# Patient Record
Sex: Female | Born: 2005 | Hispanic: No | Marital: Single | Smoking: Never smoker
Health system: Southern US, Community
[De-identification: ages and names within clinical notes are randomized; demographics above are authoritative.]

## PROBLEM LIST (undated history)

## (undated) DIAGNOSIS — F919 Conduct disorder, unspecified: Secondary | ICD-10-CM

## (undated) DIAGNOSIS — F909 Attention-deficit hyperactivity disorder, unspecified type: Secondary | ICD-10-CM

## (undated) HISTORY — DX: Conduct disorder, unspecified: F91.9

## (undated) HISTORY — PX: NO PAST SURGERIES: SHX2092

---
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
## 2024-01-03 LAB — OVA AND PARASITE EXAMINATION
CONCENTRATE RESULT:: NONE SEEN
MICRO NUMBER:: 16683350
SPECIMEN QUALITY:: ADEQUATE
TRICHROME RESULT:: NONE SEEN

## 2024-01-03 LAB — GIARDIA AND CRYPTOSPORIDIUM ANTIGEN PANEL
MICRO NUMBER:: 16683349
RESULT:: NOT DETECTED
SPECIMEN QUALITY:: ADEQUATE
Specimen Quality:: ADEQUATE
micro Number:: 16683471

## 2024-01-03 LAB — SALMONELLA/SHIGELLA CULT, CAMPY EIA AND SHIGA TOXIN RFL ECOLI
MICRO NUMBER: 16683829
MICRO NUMBER:: 16683830
MICRO NUMBER:: 16683831
Result:: NOT DETECTED
SHIGA RESULT:: NOT DETECTED
SPECIMEN QUALITY: ADEQUATE
SPECIMEN QUALITY:: ADEQUATE
SPECIMEN QUALITY:: ADEQUATE

## 2024-01-03 LAB — GASTROINTESTINAL PATHOGEN PNL

## 2024-01-03 LAB — CALPROTECTIN: Calprotectin: <5 ug/g

## 2024-01-28 ENCOUNTER — Ambulatory Visit
Admission: EM | Admit: 2024-01-28 | Discharge: 2024-01-28 | Disposition: A | Discharge status: Home / Self Care | Attending: Family Medicine | Admitting: Family Medicine

## 2024-01-28 DIAGNOSIS — Z113 Encounter for screening for infections with a predominantly sexual mode of transmission: Secondary | ICD-10-CM | POA: Insufficient documentation

## 2024-01-28 LAB — HIV ANTIBODY (ROUTINE TESTING W REFLEX): HIV Screen 4th Generation wRfx: NONREACTIVE

## 2024-01-28 NOTE — ED Triage Notes (Signed)
 Pt presents to UC for STD testing. Denies any active sx's.

## 2024-01-28 NOTE — Discharge Instructions (Signed)
 The clinic will contact you with the results of the testing done today if positive.  Follow-up as needed.

## 2024-01-28 NOTE — ED Provider Notes (Signed)
 [facility]    CSN: [id number] Arrival date & time: 01/28/24  1352      History   Chief Complaint Chief Complaint  Patient presents with   SEXUALLY TRANSMITTED DISEASE    HPI Ebony Snow is a 18 y.o. female presents for evaluation of STD screening.  She currently denies any symptoms including vaginal discharge, dysuria, fevers, nausea/vomiting, flank pain.  No known STD exposure.  Patient states she was tested 1 month ago and was negative.  No other concerns at this time.  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      Home Medications    Prior to Admission medications   Medication Sig Start Date End Date Taking? Authorizing Provider  loratadine  (CLARITIN ) 10 MG tablet Take 1 tablet (10 mg total) by mouth daily. 10/20/21   Sowles, Krichna, MD  medroxyPROGESTERone  (DEPO-PROVERA ) 150 MG/ML injection Inject 1 mL (150 mg total) into the muscle every 3 (three) months. 06/29/23   Sowles, Krichna, MD  methocarbamol  (ROBAXIN ) 500 MG tablet Take 1 tablet (500 mg total) by mouth every 8 (eight) hours as needed for muscle spasms. 08/05/23   Triplett, Kirk B, FNP  naproxen  (NAPROSYN ) 500 MG tablet Take 1 tablet (500 mg total) by mouth 2 (two) times daily with a meal. 08/05/23   Triplett, Kirk NOVAK, FNP    Family History Family History  Problem Relation Age  of Onset   Food Allergy Brother    Alcohol abuse Maternal Uncle    Clotting disorder Cousin     Social History Social History   Tobacco Use   Smoking status: Never   Smokeless tobacco: Never  Vaping Use   Vaping status: Never Used  Substance Use Topics   Alcohol use: Yes   Drug use: No     Allergies   Patient has no known allergies.   Review of Systems Review of Systems  Genitourinary:        STD screening     Physical Exam Triage Vital Signs ED Triage Vitals  Encounter Vitals Group     BP 01/28/24 1358 114/67     Girls Systolic BP Percentile --      Girls Diastolic BP Percentile --      Boys Systolic BP Percentile --      Boys Diastolic BP Percentile --      Pulse Rate 01/28/24 1358 77     Resp 01/28/24 1358 16     Temp 01/28/24 1358 98.1 F (36.7 C)     Temp Source 01/28/24 1358 Oral     SpO2 01/28/24 1358 95 %     Weight 01/28/24 1358 115 lb (52.2 kg)     Height 01/28/24 1358 5' 2 (1.575 m)     Head Circumference --      Peak Flow --  Pain Score 01/28/24 1400 0     Pain Loc --      Pain Education --      Exclude from Growth Chart --    No data found.  Updated Vital Signs BP 114/67 (BP Location: Right Arm)   Pulse 77   Temp 98.1 F (36.7 C) (Oral)   Resp 16   Ht 5' 2 (1.575 m)   Wt 115 lb (52.2 kg)   SpO2 95%   BMI 21.03 kg/m   Visual Acuity Right Eye Distance:   Left Eye Distance:   Bilateral Distance:    Right Eye Near:   Left Eye Near:    Bilateral Near:     Physical Exam Vitals and nursing note reviewed.  Constitutional:      Appearance: Normal appearance.  HENT:     Head: Normocephalic and atraumatic.  Eyes:     Pupils: Pupils are equal, round, and reactive to light.  Cardiovascular:     Rate and Rhythm: Normal rate.  Pulmonary:     Effort: Pulmonary effort is normal.  Skin:    General: Skin is warm and dry.  Neurological:     General: No focal deficit present.     Mental Status: She is alert and oriented to  person, place, and time.  Psychiatric:        Mood and Affect: Mood normal.        Behavior: Behavior normal.      UC Treatments / Results  Labs (all labs ordered are listed, but only abnormal results are displayed) Labs Reviewed  RPR  HIV ANTIBODY (ROUTINE TESTING W REFLEX)  CERVICOVAGINAL ANCILLARY ONLY    EKG   Radiology No results found.  Procedures Procedures (including critical care time)  Medications Ordered in UC Medications - No data to display  Initial Impression / Assessment and Plan / UC Course  I have reviewed the triage vital signs and the nursing notes.  Pertinent labs & imaging results that were available during my care of the patient were reviewed by me and considered in my medical decision making (see chart for details).      STD testing as ordered and will contact for any positive results.  Follow-up as needed. Final Clinical Impressions(s) / UC Diagnoses   Final diagnoses:  Screening examination for STD (sexually transmitted disease)     Discharge Instructions      The clinic will contact you with the results of the testing done today if positive. Follow up as needed   ED Prescriptions   None    PDMP not reviewed this encounter.   Loreda Myla SAUNDERS, NP 01/28/24 1416

## 2024-01-29 LAB — CERVICOVAGINAL ANCILLARY ONLY
Chlamydia: NEGATIVE
Comment: NEGATIVE
Comment: NEGATIVE
Comment: NORMAL
Neisseria Gonorrhea: NEGATIVE
Trichomonas: NEGATIVE

## 2024-01-29 LAB — RPR: RPR Ser Ql: NONREACTIVE

## 2024-03-04 ENCOUNTER — Ambulatory Visit (INDEPENDENT_AMBULATORY_CARE_PROVIDER_SITE_OTHER)

## 2024-03-04 DIAGNOSIS — Z3042 Encounter for surveillance of injectable contraceptive: Secondary | ICD-10-CM

## 2024-03-04 MED ORDER — MEDROXYPROGESTERONE ACETATE 150 MG/ML IM SUSY: PREFILLED_SYRINGE | Freq: Once | INTRAMUSCULAR | Status: AC

## 2024-03-04 NOTE — Progress Notes (Signed)
 Patient is in office today for a nurse visit for Depo. Patient Injection was given in the  Right arm. Patient tolerated injection well.

## 2024-03-06 ENCOUNTER — Ambulatory Visit

## 2024-04-03 ENCOUNTER — Ambulatory Visit (INDEPENDENT_AMBULATORY_CARE_PROVIDER_SITE_OTHER): Admitting: Nurse Practitioner

## 2024-04-03 ENCOUNTER — Encounter: Payer: Self-pay | Admitting: Nurse Practitioner

## 2024-04-03 VITALS — BP 98/60 | HR 75 | Temp 98.6°F | Ht 62.0 in | Wt 110.0 lb

## 2024-04-03 DIAGNOSIS — R634 Abnormal weight loss: Secondary | ICD-10-CM

## 2024-04-03 NOTE — Progress Notes (Signed)
 BP 98/60   Pulse 75   Temp 98.6 F (37 C)   Ht 5' 2 (1.575 m)   Wt 110 lb (49.9 kg)   SpO2 98%   BMI 20.12 kg/m    Subjective:    Patient ID: Ebony Snow, female    DOB: 2006/03/06, 18 y.o.   MRN: [id number]  HPI: Ebony Snow is a 18 y.o. female with a history of ADHD, MDD, and oppositional defiant behavior who presents today with concerns for weight loss. Patient was seen on 12/21/23 for similar concerns and lab work along with stool studies were ordered and all came back unremarkable. Weight today was 110 lbs. Weight back on 12/21/23 was 109 lbs. She is concerned that her weight has fluctuated over the years ranging from 119-109 lbs. When asked about diet, she denies eating breakfast due to not liking breakfast foods, doesn't usually get to each lunch due to work schedule and then will eat dinner at home which consists of a protein and side. When she does eat she endorses usually having to have a bowel movement right after, which can be loose or formed. She denies current abdominal pain, nausea, vomiting, or urinary symptoms. Denies fever.        06/29/2023   11:45 AM 06/29/2023   11:18 AM 05/17/2023    9:08 AM  Depression screen PHQ 2/9  Decreased Interest 0 0 0  Down, Depressed, Hopeless 0 0 0  PHQ - 2 Score 0 0 0  Altered sleeping 3    Tired, decreased energy 0    Change in appetite 3    Feeling bad or failure about yourself  0    Trouble concentrating 0    Moving slowly or fidgety/restless 0    Suicidal thoughts 0    PHQ-9 Score 6    Difficult doing work/chores Somewhat difficult      Relevant past medical, surgical, family and social history reviewed and updated as indicated. Interim medical history since our last visit reviewed. Allergies and medications reviewed and updated.  Review of Systems  Ten systems reviewed and is negative except as mentioned in HPI    Objective:     BP 98/60   Pulse 75   Temp 98.6 F (37 C)   Ht 5' 2 (1.575 m)   Wt 110 lb  (49.9 kg)   SpO2 98%   BMI 20.12 kg/m    Wt Readings from Last 3 Encounters:  04/03/24 110 lb (49.9 kg) (16%, Z= -0.98)*  01/28/24 115 lb (52.2 kg) (26%, Z= -0.63)*  12/21/23 109 lb (49.4 kg) (15%, Z= -1.02)*   * Growth percentiles are based on CDC (Girls, 2-20 Years) data.    Physical Exam Constitutional:      Appearance: Normal appearance.  HENT:     Head: Normocephalic and atraumatic.  Cardiovascular:     Rate and Rhythm: Regular rhythm.     Pulses: Normal pulses.     Heart sounds: Normal heart sounds.  Pulmonary:     Effort: Pulmonary effort is normal.     Breath sounds: Normal breath sounds.  Abdominal:     General: Abdomen is flat. Bowel sounds are normal.     Palpations: Abdomen is soft.     Tenderness: There is abdominal tenderness in the periumbilical area. There is no right CVA tenderness, left CVA tenderness, guarding or rebound.  Musculoskeletal:     Cervical back: Normal range of motion and neck supple.  Skin:    General:  Skin is warm and dry.  Neurological:     General: No focal deficit present.     Mental Status: She is alert and oriented to person, place, and time.  Psychiatric:        Mood and Affect: Mood normal.        Behavior: Behavior normal.        Thought Content: Thought content normal.        Judgment: Judgment normal.      Results for orders placed or performed during the hospital encounter of 01/28/24  Cervicovaginal ancillary only   Collection Time: 01/28/24  2:06 PM  Result Value Ref Range   Neisseria Gonorrhea Negative    Chlamydia Negative    Trichomonas Negative    Comment Normal Reference Ranger Chlamydia - Negative    Comment      Normal Reference Range Neisseria Gonorrhea - Negative   Comment Normal Reference Range Trichomonas - Negative   RPR   Collection Time: 01/28/24  2:07 PM  Result Value Ref Range   RPR Ser Ql NON REACTIVE NON REACTIVE  HIV Antibody (routine testing w rflx)   Collection Time: 01/28/24  2:07 PM   Result Value Ref Range   HIV Screen 4th Generation wRfx Non Reactive Non Reactive          Assessment & Plan:   Problem List Items Addressed This Visit   None Visit Diagnoses       Weight loss    -  Primary   Weight today 110 lbs. Advised on healthy eating patterns. Referral placed for GI. Checking throid panel with TSH   Relevant Orders   Thyroid Panel With TSH   Ambulatory referral to Gastroenterology       -Discussed the importance of eating three well balanced meals a day with 20-30 grams of protein at each meal. Keeping a food journal can help keep track of meals eaten and protein intake.  -Checking thyroid panel today with TSH. Will notify patient of lab results when they are available.  -Referral sent to GI for weight loss concerns and frequent bowel movements after eating.        Follow up plan: Return if symptoms worsen or fail to improve.  I have reviewed this encounter including the documentation in this note and/or discussed this patient with the provider, Aislinn Womack, SNP, I am certifying that I agree with the content of this note as supervising/preceptor nurse practitioner.  Mliss Spray, FNP-C [facility] 04/03/2024, 3:20 PM

## 2024-04-04 ENCOUNTER — Ambulatory Visit: Payer: Self-pay | Admitting: Nurse Practitioner

## 2024-04-04 LAB — THYROID PANEL WITH TSH
Free Thyroxine Index: 2.7 (ref 1.4–3.8)
T3 Uptake: 27 % (ref 22–35)
T4, Total: 10 ug/dL (ref 5.3–11.7)
TSH: 1.3 m[IU]/L

## 2024-04-29 ENCOUNTER — Ambulatory Visit (INDEPENDENT_AMBULATORY_CARE_PROVIDER_SITE_OTHER): Payer: Self-pay | Admitting: Family Medicine

## 2024-04-29 ENCOUNTER — Other Ambulatory Visit (HOSPITAL_COMMUNITY)
Admission: RE | Admit: 2024-04-29 | Discharge: 2024-04-29 | Disposition: A | Discharge status: Home / Self Care | Payer: Self-pay | Source: Ambulatory Visit | Attending: Family Medicine | Admitting: Family Medicine

## 2024-04-29 ENCOUNTER — Encounter: Payer: Self-pay | Admitting: Family Medicine

## 2024-04-29 VITALS — BP 116/62 | HR 82 | Temp 97.7°F | Resp 16 | Ht 62.0 in | Wt 109.1 lb

## 2024-04-29 DIAGNOSIS — Z23 Encounter for immunization: Secondary | ICD-10-CM | POA: Diagnosis not present

## 2024-04-29 DIAGNOSIS — Z113 Encounter for screening for infections with a predominantly sexual mode of transmission: Secondary | ICD-10-CM

## 2024-04-29 NOTE — Progress Notes (Signed)
 Name: Ebony Snow   MRN: [id number]    DOB: 2006-02-28   Date:04/29/2024       Progress Note  Subjective  Chief Complaint  Chief Complaint  Patient presents with   Exposure to STD   std screening   Discussed the use of AI scribe software for clinical note transcription with the patient, who gave verbal consent to proceed.  History of Present Illness Ebony Snow is a 18 year old female who presents for routine follow-up and HIV testing.  She underwent HIV testing in July and was advised to return for follow-up testing in 20 months. Approximately two weeks ago, she was tested for gonorrhea and chlamydia at an urgent care facility. She recalls a past incident where someone falsely accused her of transmitting HIV, but her tests were negative.  She reports significant weight fluctuations, noting a weight of 112 lbs in January, which decreased to 109 lbs. Previously, her weight ranged between 130 and 120 lbs over a three-month period. She has a history of recurrent abdominal pain and constipation and is scheduled to see a gastrointestinal specialist on December 7th.  She is currently taking vitamin D  supplements daily, and her levels improved from 15 to 29. She has a history of depression.  Socially, she works at [employer] and drives herself. She has a strained relationship with her father, who recently asked her to leave home, resulting in her staying at a hotel for a week. Her relationship with her mother is improving, and her mother is expecting another child. She has many siblings, with her mother having children frequently.    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   Mood disorder 08/24/2018    ADHD     Past Surgical History:  Procedure Laterality Date   NO PAST SURGERIES      Family History  Problem Relation Age of Onset   Food Allergy Brother    Alcohol abuse Maternal Uncle    Clotting disorder Cousin     Social History   Tobacco Use   Smoking status: Never   Smokeless tobacco: Never  Substance Use Topics   Alcohol use: Yes     Current Outpatient Medications:    medroxyPROGESTERone  (DEPO-PROVERA ) 150 MG/ML injection, Inject 1 mL (150 mg total) into the muscle every 3 (three) months., Disp: 1 mL, Rfl: 3  No Known Allergies  I personally reviewed active problem list, medication list, allergies with the patient/caregiver today.   ROS  Ten systems reviewed and is negative except as mentioned in HPI    Objective Physical Exam  CONSTITUTIONAL: Patient appears well-developed and well-nourished.  No distress. HEENT: Head atraumatic, normocephalic, neck supple. CARDIOVASCULAR: Normal rate, regular rhythm and normal heart sounds.  No murmur heard. No BLE edema. PULMONARY: Effort normal and breath sounds normal. No respiratory distress. ABDOMINAL: There is no tenderness or distention. MUSCULOSKELETAL: Normal gait. Without gross motor or sensory deficit. PSYCHIATRIC: Patient has a normal mood and affect. behavior is normal. Judgment and thought content normal.  Vitals:   04/29/24 0801  BP: 116/62  Pulse: 82  Resp: 16  Temp: 97.7 F (36.5 C)  Weight: 109 lb 1.6 oz (49.5 kg)  Height: 5' 2 (1.575 m)    Body mass index is 19.95  kg/m.  Recent Results (from the past 2160 hours)  Thyroid Panel With TSH     Status: None   Collection Time: 04/03/24  2:48 PM  Result Value Ref Range   T3 Uptake 27 22 - 35 %   T4, Total 10.0 5.3 - 11.7 mcg/dL   Free Thyroxine Index 2.7 1.4 - 3.8   TSH 1.30 mIU/L    Comment:            Reference Range .            1-19 Years 0.50-4.30 .                Pregnancy Ranges            First trimester   0.26-2.66             Second trimester  0.55-2.73            Third trimester   0.43-2.91       PHQ2/9:    04/29/2024    8:00 AM 06/29/2023   11:45 AM 06/29/2023   11:18 AM 05/17/2023    9:08 AM 06/15/2022    8:55 AM  Depression screen PHQ 2/9  Decreased Interest 0 0 0 0 0  Down, Depressed, Hopeless 0 0 0 0 0  PHQ - 2 Score 0 0 0 0 0  Altered sleeping  3   0  Tired, decreased energy  0   0  Change in appetite  3   0  Feeling bad or failure about yourself   0   0  Trouble concentrating  0   0  Moving slowly or fidgety/restless  0   0  Suicidal thoughts  0   0  PHQ-9 Score  6    0   Difficult doing work/chores  Somewhat difficult        Data saved with a previous flowsheet row definition    phq 9 is negative  Fall Risk:    04/29/2024    8:00 AM 12/21/2023    2:25 PM 06/29/2023   11:18 AM 05/17/2023    9:03 AM 04/15/2022    8:12 AM  Fall Risk   Falls in the past year? 0 0 1 1 0  Number falls in past yr: 0 0 0 0 0  Injury with Fall? 0 0 1 1 0  Risk for fall due to : No Fall Risks  Impaired balance/gait Impaired balance/gait No Fall Risks  Follow up Falls evaluation completed Falls evaluation completed Falls prevention discussed;Education provided;Falls evaluation completed Falls prevention discussed;Education provided;Falls evaluation completed Falls prevention discussed;Education provided      Data saved with a previous flowsheet row definition      Assessment & Plan Screening for sexually transmitted infections Recent HIV test in July, follow-up in two months. Recent gonorrhea and chlamydia tests in August. Discussed partner's false HIV claims and advised on safe practices. - Encouraged use of condoms and pre-sex testing of partners.  Immunization and vaccination counseling Due for tetanus booster. Declined COVID-19 and flu vaccinations. Discussed flu vaccination benefits and insurance coverage. - Encouraged flu vaccination despite personal concerns.  Vitamin D  deficiency Vitamin D   levels improved from 15 to 29 but remain slightly low. Continues daily supplements. - Continue daily vitamin D  supplementation.  Unintentional weight loss and recurrent abdominal pain Weight decreased from 112 lbs to 109 lbs. Recurrent abdominal pain and constipation. GI specialist referral scheduled. - Attend GI specialist appointment on December 8th.

## 2024-04-30 ENCOUNTER — Ambulatory Visit: Payer: Self-pay | Admitting: Family Medicine

## 2024-04-30 LAB — CERVICOVAGINAL ANCILLARY ONLY
Bacterial Vaginitis (gardnerella): NEGATIVE
Candida Glabrata: NEGATIVE
Candida Vaginitis: POSITIVE — AB
Chlamydia: NEGATIVE
Comment: NEGATIVE
Comment: NEGATIVE
Comment: NEGATIVE
Comment: NEGATIVE
Comment: NEGATIVE
Comment: NORMAL
Neisseria Gonorrhea: NEGATIVE
Trichomonas: NEGATIVE

## 2024-05-01 LAB — HIV ANTIBODY (ROUTINE TESTING W REFLEX)
HIV 1&2 Ab, 4th Generation: NONREACTIVE
HIV FINAL INTERPRETATION: NEGATIVE

## 2024-05-01 LAB — RPR: RPR Ser Ql: NONREACTIVE

## 2024-05-01 LAB — HEPATITIS C ANTIBODY: Hepatitis C Ab: NONREACTIVE

## 2024-05-21 ENCOUNTER — Ambulatory Visit
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
## 2024-05-27 ENCOUNTER — Encounter: Payer: Self-pay | Admitting: Family Medicine

## 2024-05-27 ENCOUNTER — Ambulatory Visit: Admitting: Family Medicine

## 2024-05-27 VITALS — BP 99/62 | HR 83 | Temp 97.7°F | Ht 63.0 in | Wt 107.0 lb

## 2024-05-27 DIAGNOSIS — R197 Diarrhea, unspecified: Secondary | ICD-10-CM

## 2024-05-27 DIAGNOSIS — R634 Abnormal weight loss: Secondary | ICD-10-CM

## 2024-05-27 DIAGNOSIS — K921 Melena: Secondary | ICD-10-CM

## 2024-05-27 DIAGNOSIS — R1030 Lower abdominal pain, unspecified: Secondary | ICD-10-CM

## 2024-05-27 MED ORDER — DICYCLOMINE HCL 10 MG PO CAPS: 10.0000 mg | ORAL_CAPSULE | Freq: Three times a day (TID) | ORAL | 0 refills | Status: DC

## 2024-05-27 MED ORDER — NA SULFATE-K SULFATE-MG SULF 17.5-3.13-1.6 GM/177ML PO SOLN: 1.0000 | Freq: Once | ORAL | 0 refills | Status: AC

## 2024-05-27 NOTE — Patient Instructions (Addendum)
 Start benefiber to help bulk up stools Can take Imodium, which is over the counter, if having diarrhea as long as no bleeding in stool or fevers.

## 2024-06-03 ENCOUNTER — Telehealth: Payer: Self-pay

## 2024-06-03 ENCOUNTER — Other Ambulatory Visit: Payer: Self-pay | Admitting: Family Medicine

## 2024-06-03 DIAGNOSIS — N946 Dysmenorrhea, unspecified: Secondary | ICD-10-CM

## 2024-06-03 MED ORDER — MEDROXYPROGESTERONE ACETATE 150 MG/ML IM SUSP: 150.0000 mg | INTRAMUSCULAR | 0 refills | Status: DC

## 2024-06-03 NOTE — Telephone Encounter (Signed)
 Copied from CRM #[id number]. Topic: Clinical - Medication Question >> Jun 03, 2024 11:02 AM Emylou G wrote: Reason for CRM: Patient called.. said she is 2 weeks past due of her medroxyPROGESTERone  (DEPO-PROVERA ) 150 MG/ML injection Did we or can we order it so she can come by to get her shot?  Pls call patient. >> Jun 03, 2024  2:37 PM Amy B wrote: 3rd attempt:  Patient is teary stating she still has not heard from anyone about obtaining her Depo Provera  shot for birth control.  She has called twice before asking for assistance.  Please call her at [phone]  >> Jun 03, 2024  2:28 PM Sophia H wrote: Patient is calling in again to check in on this request - Advised we are still waiting on a response from provider.

## 2024-06-04 ENCOUNTER — Telehealth: Payer: Self-pay

## 2024-06-04 NOTE — Telephone Encounter (Signed)
 Lvm for pt to call back and schedule a CPE

## 2024-06-04 NOTE — Telephone Encounter (Signed)
 Copied from CRM #[id number]. Topic: Clinical - Medication Question >> Jun 04, 2024  8:44 AM Everette C wrote: Reason for CRM: The patient has declined to be scheduled for a physical and would like to please speak with a member of staff about their prescription coordination when possible.

## 2024-06-04 NOTE — Telephone Encounter (Signed)
 Called pt back no answer left vm TCB

## 2024-06-06 ENCOUNTER — Encounter: Payer: Self-pay | Admitting: Emergency Medicine

## 2024-06-06 ENCOUNTER — Ambulatory Visit
Admission: EM | Admit: 2024-06-06 | Discharge: 2024-06-06 | Disposition: A | Discharge status: Home / Self Care | Source: Home / Self Care

## 2024-06-06 DIAGNOSIS — J069 Acute upper respiratory infection, unspecified: Secondary | ICD-10-CM | POA: Diagnosis not present

## 2024-06-06 DIAGNOSIS — R6889 Other general symptoms and signs: Secondary | ICD-10-CM | POA: Insufficient documentation

## 2024-06-06 DIAGNOSIS — J029 Acute pharyngitis, unspecified: Secondary | ICD-10-CM | POA: Diagnosis not present

## 2024-06-06 LAB — POCT INFLUENZA A/B
Influenza A, POC: NEGATIVE
Influenza B, POC: NEGATIVE

## 2024-06-06 LAB — POCT RAPID STREP A (OFFICE): Rapid Strep A Screen: NEGATIVE

## 2024-06-06 LAB — POC SOFIA SARS ANTIGEN FIA: SARS Coronavirus 2 Ag: NEGATIVE

## 2024-06-06 MED ORDER — PROMETHAZINE-DM 6.25-15 MG/5ML PO SYRP: 5.0000 mL | ORAL_SOLUTION | Freq: Four times a day (QID) | ORAL | 0 refills | Status: DC | PRN

## 2024-06-06 MED ORDER — IPRATROPIUM BROMIDE 0.06 % NA SOLN: 2.0000 | Freq: Four times a day (QID) | NASAL | 12 refills | Status: AC

## 2024-06-06 MED ORDER — BENZONATATE 100 MG PO CAPS: 200.0000 mg | ORAL_CAPSULE | Freq: Three times a day (TID) | ORAL | 0 refills | Status: DC
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
## 2024-06-06 NOTE — Discharge Instructions (Signed)
 Your strep test today was negative.  We will send your swab for culture.  Your respiratory tests were also negative for COVID or influenza.  I do feel that you have a respiratory virus which is causing your symptoms.  Gargle with warm salt water 2-3 times a day to soothe your throat, aid in pain relief, and aid in healing.  Take over-the-counter Tylenol  and/or ibuprofen according to the package instructions as needed for pain.  You can also use Chloraseptic or Sucrets lozenges, 1 lozenge every 2 hours as needed for throat pain.  Use the Atrovent  nasal spray, 2 squirts in each nostril every 6 hours, as needed for runny nose and postnasal drip.  Use the Tessalon  Perles every 8 hours during the day.  Take them with a small sip of water.  They may give you some numbness to the base of your tongue or a metallic taste in your mouth, this is normal.  Use the Promethazine DM cough syrup at bedtime for cough and congestion.  It will make you drowsy so do not take it during the day.  Return for reevaluation or see your primary care provider for any new or worsening symptoms.

## 2024-06-08 LAB — CULTURE, GROUP A STREP (THRC)

## 2024-06-10 ENCOUNTER — Ambulatory Visit (HOSPITAL_COMMUNITY): Payer: Self-pay

## 2024-06-16 ENCOUNTER — Ambulatory Visit: Admission: EM | Admit: 2024-06-16 | Discharge: 2024-06-16 | Disposition: A | Discharge status: Home / Self Care

## 2024-06-16 DIAGNOSIS — R0982 Postnasal drip: Secondary | ICD-10-CM | POA: Diagnosis not present

## 2024-06-16 NOTE — ED Triage Notes (Signed)
 Patient comes in to have depo injection. I informed patient that we do not give depo injections. Patient also here to follow up on URI that she had 2 weeks ago. Patient is no longer having sx.

## 2024-06-16 NOTE — ED Provider Notes (Signed)
 " [facility]    CSN: [id number] Arrival date & time: 06/16/24  1210      History   Chief Complaint Chief Complaint  Patient presents with   Follow-up    HPI Jordayn Mink is a 18 y.o. female  presenting for depo injection  Patient comes in to have depo injection. I informed patient that we do not give depo injections. Patient also here to follow up on URI that she had 2 weeks ago. States persistent PND, which caused a sore throat for a few days, but no longer. Denies cough, SOB, CP. States she nasal spray she was prescribed isn't helping.   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      Home Medications    Prior to Admission medications  Medication Sig Start Date End Date Taking? Authorizing Provider  benzonatate  (TESSALON ) 100 MG capsule Take 2 capsules (200 mg total) by mouth every 8 (eight) hours. 06/06/24   Bernardino Ditch, NP  ipratropium (ATROVENT ) 0.06 % nasal spray Place 2 sprays into both nostrils 4 (four) times daily. 06/06/24   Bernardino Ditch, NP  promethazine-dextromethorphan (PROMETHAZINE-DM) 6.25-15 MG/5ML syrup Take 5 mLs by mouth 4 (four) times daily as needed. 06/06/24   Bernardino Ditch, NP    Family History Family History  Problem Relation Age of Onset   Food Allergy Brother    Alcohol abuse Maternal Uncle    Clotting disorder Cousin      Social History Social History[1]   Allergies   Patient has no known allergies.   Review of Systems Review of Systems  Constitutional:  Negative for appetite change, chills and fever.  HENT:  Positive for congestion. Negative for ear pain, rhinorrhea, sinus pressure, sinus pain and sore throat.   Eyes:  Negative for redness and visual disturbance.  Respiratory:  Negative for cough, chest tightness, shortness of breath and wheezing.   Cardiovascular:  Negative for chest pain and palpitations.  Gastrointestinal:  Negative for abdominal pain, constipation, diarrhea, nausea and vomiting.  Genitourinary:  Negative for dysuria, frequency and urgency.  Musculoskeletal:  Negative for myalgias.  Neurological:  Negative for dizziness, weakness and headaches.  Psychiatric/Behavioral:  Negative for confusion.   All other systems reviewed and are negative.    Physical Exam Triage Vital Signs ED Triage Vitals  Encounter Vitals Group     BP 06/16/24 1304 111/66     Girls Systolic BP Percentile --      Girls Diastolic BP Percentile --      Boys Systolic BP Percentile --      Boys Diastolic BP Percentile --      Pulse Rate 06/16/24 1304 80     Resp 06/16/24 1304 18     Temp 06/16/24 1304 98 F (36.7 C)     Temp Source 06/16/24 1304 Oral  SpO2 06/16/24 1304 100 %     Weight 06/16/24 1303 109 lb 9.5 oz (49.7 kg)     Height --      Head Circumference --      Peak Flow --      Pain Score 06/16/24 1303 0     Pain Loc --      Pain Education --      Exclude from Growth Chart --    No data found.  Updated Vital Signs BP 111/66 (BP Location: Left Arm)   Pulse 80   Temp 98 F (36.7 C) (Oral)   Resp 18   Wt 109 lb 9.5 oz (49.7 kg)   SpO2 100%   BMI 19.41 kg/m   Visual Acuity Right Eye Distance:   Left Eye Distance:   Bilateral Distance:    Right Eye Near:   Left Eye Near:    Bilateral Near:     Physical Exam Vitals reviewed.  Constitutional:      General: She is  not in acute distress.    Appearance: Normal appearance. She is not ill-appearing.  HENT:     Head: Normocephalic and atraumatic.     Right Ear: Tympanic membrane, ear canal and external ear normal. No tenderness. No middle ear effusion. There is no impacted cerumen. Tympanic membrane is not perforated, erythematous, retracted or bulging.     Left Ear: Tympanic membrane, ear canal and external ear normal. No tenderness.  No middle ear effusion. There is no impacted cerumen. Tympanic membrane is not perforated, erythematous, retracted or bulging.     Nose: Nose normal. No congestion.     Mouth/Throat:     Mouth: Mucous membranes are moist.     Pharynx: Uvula midline. No oropharyngeal exudate or posterior oropharyngeal erythema.     Tonsils: No tonsillar exudate.  Eyes:     Extraocular Movements: Extraocular movements intact.     Pupils: Pupils are equal, round, and reactive to light.  Cardiovascular:     Rate and Rhythm: Normal rate and regular rhythm.     Heart sounds: Normal heart sounds.  Pulmonary:     Effort: Pulmonary effort is normal.     Breath sounds: Normal breath sounds. No decreased breath sounds, wheezing, rhonchi or rales.  Abdominal:     Palpations: Abdomen is soft.     Tenderness: There is no abdominal tenderness. There is no guarding or rebound.  Lymphadenopathy:     Cervical: No cervical adenopathy.     Right cervical: No superficial, deep or posterior cervical adenopathy.    Left cervical: No superficial, deep or posterior cervical adenopathy.  Skin:    Comments: No rash   Neurological:     General: No focal deficit present.     Mental Status: She is alert and oriented to person, place, and time.  Psychiatric:        Mood and Affect: Mood normal.        Behavior: Behavior normal.        Thought Content: Thought content normal.        Judgment: Judgment normal.      UC Treatments / Results  Labs (all labs ordered are listed, but only abnormal results are  displayed) Labs Reviewed - No data to display  EKG   Radiology No results found.  Procedures Procedures (including critical care time)  Medications Ordered in UC Medications - No data to display  Initial Impression / Assessment and Plan / UC Course  I  have reviewed the triage vital signs and the nursing notes.  Pertinent labs & imaging results that were available during my care of the patient were reviewed by me and considered in my medical decision making (see chart for details).     Patient is a pleasant 18 y.o. female presenting for f/u of viral URI that she had 10 days ago. The patient is afebrile and nontachycardic.  Antipyretic has not been administered today. Symptoms have largely resolved.  Postnasal drip is not controlled on Atrovent  nasal spray, but she declines a new prescription of a different nasal spray.   Final Clinical Impressions(s) / UC Diagnoses   Final diagnoses:  Postnasal drip   Discharge Instructions   None    ED Prescriptions   None    PDMP not reviewed this encounter.     [1]  Social History Tobacco Use   Smoking status: Never   Smokeless tobacco: Never  Vaping Use   Vaping status: Every Day  Substance Use Topics   Alcohol use: Yes   Drug use: Yes    Types: Marijuana     Arlyss Leita BRAVO, PA-C 06/16/24 1357  "

## 2024-07-04 ENCOUNTER — Encounter: Payer: Self-pay | Admitting: Family Medicine

## 2024-07-04 ENCOUNTER — Ambulatory Visit: Admitting: Family Medicine

## 2024-07-04 ENCOUNTER — Other Ambulatory Visit (HOSPITAL_COMMUNITY)
Admission: RE | Admit: 2024-07-04 | Discharge: 2024-07-04 | Disposition: A | Discharge status: Home / Self Care | Source: Ambulatory Visit | Attending: Family Medicine | Admitting: Family Medicine

## 2024-07-04 VITALS — BP 120/74 | HR 80 | Resp 16 | Ht 63.0 in | Wt 110.5 lb

## 2024-07-04 DIAGNOSIS — Z113 Encounter for screening for infections with a predominantly sexual mode of transmission: Secondary | ICD-10-CM | POA: Diagnosis present

## 2024-07-04 DIAGNOSIS — Z13220 Encounter for screening for lipoid disorders: Secondary | ICD-10-CM

## 2024-07-04 DIAGNOSIS — Z131 Encounter for screening for diabetes mellitus: Secondary | ICD-10-CM

## 2024-07-04 DIAGNOSIS — Z01419 Encounter for gynecological examination (general) (routine) without abnormal findings: Secondary | ICD-10-CM

## 2024-07-04 DIAGNOSIS — Z0000 Encounter for general adult medical examination without abnormal findings: Secondary | ICD-10-CM

## 2024-07-04 DIAGNOSIS — Z3042 Encounter for surveillance of injectable contraceptive: Secondary | ICD-10-CM

## 2024-07-04 LAB — POCT URINE PREGNANCY: Preg Test, Ur: NEGATIVE

## 2024-07-04 MED ORDER — MEDROXYPROGESTERONE ACETATE 150 MG/ML IM SUSY
150.0000 mg | PREFILLED_SYRINGE | Freq: Once | INTRAMUSCULAR | Status: AC
  Administered 2024-07-04: 150 mg via INTRAMUSCULAR

## 2024-07-04 MED ORDER — MEDROXYPROGESTERONE ACETATE 150 MG/ML IM SUSP: 150.0000 mg | INTRAMUSCULAR | 3 refills | Status: AC

## 2024-07-04 NOTE — Progress Notes (Signed)
 Adolescent Well Care Visit Ebony Snow is a 19 y.o. female who is here for well care.     PCP:  Rodolfo Notaro, MD   History was provided by the patient.  Confidentiality was discussed with the patient and, if applicable, with caregiver as well. Patient's personal or confidential phone number: [phone]   Current Issues: Current concerns include: was not able to fill Depo and has not been on any contraceptives for the past 3 months.   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  no Stressors of note: financial stress   Education: Currently at [employer]  Menstruation:   No LMP recorded (lmp unknown). Patient has had an injection. Menstrual History: on depo but no shots in over 3 months    Patient has a dental home: needs to find one   Confidential social history: Tobacco?  No Secondhand smoke exposure?  yes Drugs/ETOH?  Yes marijuana about once a month, socially drinks alcohol   Sexually Active?  yes   Pregnancy Prevention: does not use condoms, wants to resume Depo   Safe at home, in school & in relationships?  Yes Safe to self?  Yes    PHQ-9 completed and results indicated   Negative  Physical Exam:  Vitals:   07/04/24 0914  BP: 120/74  Pulse: 80  Resp: 16  SpO2: 98%  Weight: 110 lb 8 oz (50.1 kg)  Height: 5' 3 (1.6 m)   BP 120/74   Pulse 80   Resp 16   Ht 5' 3 (1.6 m)   Wt 110 lb 8 oz  (50.1 kg)   LMP  (LMP Unknown)   SpO2 98%   BMI 19.57 kg/m  Body mass index: body mass index is 19.57 kg/m. Blood pressure %iles are not available for patients who are 18 years or older.    Physical Exam  Constitutional: Patient appears well-developed and well-nourished. No distress.  HENT: Head: Normocephalic and atraumatic. Ears: B TMs ok, no erythema or effusion; Nose: Nose normal. Mouth/Throat: Oropharynx is clear and moist. No oropharyngeal exudate.  Eyes: Conjunctivae and EOM are normal. Pupils are equal, round, and reactive to light. No scleral icterus.  Neck: Normal range of motion. Neck supple. No JVD present. No thyromegaly present.  Cardiovascular: Normal rate, regular rhythm and normal heart sounds.  No murmur heard. No BLE edema. Pulmonary/Chest: Effort normal and breath sounds normal. No respiratory distress. Abdominal: Soft. Bowel sounds are normal, no distension. There is no tenderness. no masses Breast: no lumps or masses, no nipple discharge or rashes FEMALE GENITALIA:  Not done  RECTAL: not done  Musculoskeletal: Normal range of motion, no joint effusions. No gross deformities Neurological: he is alert and oriented to person, place, and time. No cranial nerve deficit. Coordination, balance, strength, speech and gait are normal.  Skin: Skin is warm and dry. No rash noted. No erythema.  Psychiatric: Patient has a normal  mood and affect. behavior is normal. Judgment and thought content normal.   Assessment and Plan:   1. Well woman exam (Primary)  - CBC with Differential/Platelet - Comprehensive metabolic panel with GFR - VITAMIN D  25 Hydroxy (Vit-D Deficiency, Fractures)  2. Surveillance for Depo-Provera  contraception  - medroxyPROGESTERone  (DEPO-PROVERA ) 150 MG/ML injection; Inject 1 mL (150 mg total) into the muscle every 3 (three) months.  Dispense: 1 mL; Refill: 3  3. Routine screening for STI (sexually transmitted infection)  - HIV Antibody (routine  testing w rflx) - RPR W/RFLX TO RPR TITER, TREPONEMAL AB, SCREEN AND DIAGNOSIS - Cervicovaginal ancillary only  4. Screening cholesterol level  - Cholesterol, Total  5. Diabetes mellitus screening  - Hemoglobin A1c    BMI is appropriate for age   Counseling provided for  vaccine components     Sriram Febles F Danel Studzinski, MD

## 2024-07-05 LAB — COMPREHENSIVE METABOLIC PANEL WITH GFR
AG Ratio: 1.6 (calc) (ref 1.0–2.5)
ALT: 11 U/L (ref 5–32)
AST: 13 U/L (ref 12–32)
Albumin: 4.4 g/dL (ref 3.6–5.1)
Alkaline phosphatase (APISO): 50 U/L (ref 36–128)
BUN: 9 mg/dL (ref 7–20)
CO2: 29 mmol/L (ref 20–32)
Calcium: 9.4 mg/dL (ref 8.9–10.4)
Chloride: 105 mmol/L (ref 98–110)
Creat: 0.63 mg/dL (ref 0.50–0.96)
Globulin: 2.8 g/dL (ref 2.0–3.8)
Glucose, Bld: 85 mg/dL (ref 65–99)
Potassium: 4.1 mmol/L (ref 3.8–5.1)
Sodium: 139 mmol/L (ref 135–146)
Total Bilirubin: 0.5 mg/dL (ref 0.2–1.1)
Total Protein: 7.2 g/dL (ref 6.3–8.2)
eGFR: 131 mL/min/1.73m2

## 2024-07-05 LAB — CBC WITH DIFFERENTIAL/PLATELET
Absolute Lymphocytes: 1776 {cells}/uL (ref 850–3900)
Absolute Monocytes: 259 {cells}/uL (ref 200–950)
Basophils Absolute: 11 {cells}/uL (ref 0–200)
Basophils Relative: 0.3 %
Eosinophils Absolute: 70 {cells}/uL (ref 15–500)
Eosinophils Relative: 1.9 %
HCT: 40.5 % (ref 35.9–46.0)
Hemoglobin: 13.3 g/dL (ref 11.7–15.5)
MCH: 30.6 pg (ref 27.0–33.0)
MCHC: 32.8 g/dL (ref 31.6–35.4)
MCV: 93.3 fL (ref 81.4–101.7)
MPV: 11 fL (ref 7.5–12.5)
Monocytes Relative: 7 %
Neutro Abs: 1584 {cells}/uL (ref 1500–7800)
Neutrophils Relative %: 42.8 %
Platelets: 273 Thousand/uL (ref 140–400)
RBC: 4.34 Million/uL (ref 3.80–5.10)
RDW: 11.9 % (ref 11.0–15.0)
Total Lymphocyte: 48 %
WBC: 3.7 Thousand/uL — ABNORMAL LOW (ref 3.8–10.8)

## 2024-07-05 LAB — CERVICOVAGINAL ANCILLARY ONLY
Bacterial Vaginitis (gardnerella): NEGATIVE
Candida Glabrata: NEGATIVE
Candida Vaginitis: NEGATIVE
Chlamydia: NEGATIVE
Comment: NEGATIVE
Comment: NEGATIVE
Comment: NEGATIVE
Comment: NEGATIVE
Comment: NEGATIVE
Comment: NORMAL
Neisseria Gonorrhea: NEGATIVE
Trichomonas: NEGATIVE

## 2024-07-05 LAB — HIV ANTIBODY (ROUTINE TESTING W REFLEX)
HIV 1&2 Ab, 4th Generation: NONREACTIVE
HIV FINAL INTERPRETATION: NEGATIVE

## 2024-07-05 LAB — CHOLESTEROL, TOTAL: Cholesterol: 142 mg/dL

## 2024-07-05 LAB — SYPHILIS: RPR W/REFLEX TO RPR TITER AND TREPONEMAL ANTIBODIES, TRADITIONAL SCREENING AND DIAGNOSIS ALGORITHM: RPR Ser Ql: NONREACTIVE

## 2024-07-05 LAB — VITAMIN D 25 HYDROXY (VIT D DEFICIENCY, FRACTURES): Vit D, 25-Hydroxy: 15 ng/mL — ABNORMAL LOW (ref 30–100)

## 2024-07-05 LAB — HEMOGLOBIN A1C
Hgb A1c MFr Bld: 5.3 %
Mean Plasma Glucose: 105 mg/dL
eAG (mmol/L): 5.8 mmol/L

## 2024-07-07 ENCOUNTER — Ambulatory Visit: Payer: Self-pay | Admitting: Family Medicine

## 2024-07-08 ENCOUNTER — Encounter: Payer: Self-pay | Admitting: Gastroenterology

## 2024-07-10 ENCOUNTER — Encounter
Admission: RE | Disposition: A | Discharge status: Home / Self Care | Payer: Self-pay | Source: Home / Self Care | Attending: Gastroenterology

## 2024-07-10 ENCOUNTER — Encounter: Payer: Self-pay | Admitting: Gastroenterology

## 2024-07-10 ENCOUNTER — Other Ambulatory Visit: Payer: Self-pay

## 2024-07-10 ENCOUNTER — Ambulatory Visit
Admission: RE | Admit: 2024-07-10 | Discharge: 2024-07-10 | Disposition: A | Discharge status: Home / Self Care | Attending: Gastroenterology | Admitting: Gastroenterology

## 2024-07-10 ENCOUNTER — Ambulatory Visit: Payer: Self-pay

## 2024-07-10 DIAGNOSIS — R1115 Cyclical vomiting syndrome unrelated to migraine: Secondary | ICD-10-CM | POA: Insufficient documentation

## 2024-07-10 DIAGNOSIS — R634 Abnormal weight loss: Secondary | ICD-10-CM | POA: Diagnosis not present

## 2024-07-10 DIAGNOSIS — R1030 Lower abdominal pain, unspecified: Secondary | ICD-10-CM

## 2024-07-10 DIAGNOSIS — R1114 Bilious vomiting: Secondary | ICD-10-CM

## 2024-07-10 DIAGNOSIS — Z681 Body mass index (BMI) 19 or less, adult: Secondary | ICD-10-CM | POA: Diagnosis not present

## 2024-07-10 DIAGNOSIS — R197 Diarrhea, unspecified: Secondary | ICD-10-CM | POA: Diagnosis not present

## 2024-07-10 HISTORY — PX: COLONOSCOPY: SHX5424

## 2024-07-10 HISTORY — PX: ESOPHAGOGASTRODUODENOSCOPY: SHX5428

## 2024-07-10 LAB — POCT PREGNANCY, URINE: Preg Test, Ur: NEGATIVE

## 2024-07-10 MED ORDER — LACTATED RINGERS IV SOLN: INTRAVENOUS | Status: DC

## 2024-07-10 MED ORDER — LIDOCAINE HCL (CARDIAC) PF 100 MG/5ML IV SOSY
PREFILLED_SYRINGE | INTRAVENOUS | Status: DC | PRN
  Administered 2024-07-10: 50 mg via INTRAVENOUS

## 2024-07-10 MED ORDER — GLYCOPYRROLATE 0.2 MG/ML IJ SOLN
INTRAMUSCULAR | Status: DC | PRN
  Administered 2024-07-10: .2 mg via INTRAVENOUS

## 2024-07-10 MED ORDER — DEXMEDETOMIDINE HCL IN NACL 80 MCG/20ML IV SOLN
INTRAVENOUS | Status: DC | PRN
  Administered 2024-07-10: 8 ug via INTRAVENOUS

## 2024-07-10 MED ORDER — PHENYLEPHRINE HCL (PRESSORS) 10 MG/ML IV SOLN
INTRAVENOUS | Status: DC | PRN
  Administered 2024-07-10 (×2): 80 ug via INTRAVENOUS

## 2024-07-10 MED ORDER — SODIUM CHLORIDE 0.9 % IV SOLN: INTRAVENOUS | Status: DC

## 2024-07-10 MED ORDER — PROPOFOL 10 MG/ML IV BOLUS
INTRAVENOUS | Status: DC | PRN
  Administered 2024-07-10: 20 mg via INTRAVENOUS
  Administered 2024-07-10: 30 mg via INTRAVENOUS
  Administered 2024-07-10 (×2): 40 mg via INTRAVENOUS
  Administered 2024-07-10 (×2): 30 mg via INTRAVENOUS
  Administered 2024-07-10: 20 mg via INTRAVENOUS
  Administered 2024-07-10: 30 mg via INTRAVENOUS
  Administered 2024-07-10: 80 mg via INTRAVENOUS
  Administered 2024-07-10: 30 mg via INTRAVENOUS
  Administered 2024-07-10: 20 mg via INTRAVENOUS

## 2024-07-10 MED ORDER — STERILE WATER FOR IRRIGATION IR SOLN
Status: DC | PRN
  Administered 2024-07-10 (×2): 1

## 2024-07-10 NOTE — Anesthesia Preprocedure Evaluation (Signed)
 Anesthesia Evaluation  Patient identified by MRN, date of birth, ID band Patient awake    Reviewed: Allergy & Precautions, H&P , NPO status , Patient's Chart, lab work & pertinent test results  Airway Mallampati: II  TM Distance: >3 FB Neck ROM: Full    Dental no notable dental hx.    Pulmonary neg pulmonary ROS   Pulmonary exam normal breath sounds clear to auscultation       Cardiovascular negative cardio ROS Normal cardiovascular exam Rhythm:Regular Rate:Normal     Neuro/Psych negative neurological ROS  negative psych ROS   GI/Hepatic negative GI ROS, Neg liver ROS,,,  Endo/Other  negative endocrine ROS    Renal/GU negative Renal ROS  negative genitourinary   Musculoskeletal negative musculoskeletal ROS (+)    Abdominal   Peds negative pediatric ROS (+)  Hematology negative hematology ROS (+)   Anesthesia Other Findings   Reproductive/Obstetrics negative OB ROS                             Anesthesia Physical Anesthesia Plan  ASA: 2  Anesthesia Plan: General   Post-op Pain Management:    Induction: Intravenous  PONV Risk Score and Plan:   Airway Management Planned:   Additional Equipment:   Intra-op Plan:   Post-operative Plan: Extubation in OR  Informed Consent: I have reviewed the patients History and Physical, chart, labs and discussed the procedure including the risks, benefits and alternatives for the proposed anesthesia with the patient or authorized representative who has indicated his/her understanding and acceptance.     Dental advisory given  Plan Discussed with: CRNA  Anesthesia Plan Comments:        Anesthesia Quick Evaluation
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
## 2024-07-10 NOTE — Anesthesia Postprocedure Evaluation (Signed)
"   Anesthesia Post Note  Patient: Ebony Snow  Procedure(s) Performed: COLONOSCOPY WITH BIOPSY (Rectum) EGD (ESOPHAGOGASTRODUODENOSCOPY) WITH BIOPSY (Mouth)  Patient location during evaluation: PACU Anesthesia Type: General Level of consciousness: awake and alert Pain management: pain level controlled Vital Signs Assessment: post-procedure vital signs reviewed and stable Respiratory status: spontaneous breathing, nonlabored ventilation, respiratory function stable and patient connected to nasal cannula oxygen Cardiovascular status: blood pressure returned to baseline and stable Postop Assessment: no apparent nausea or vomiting Anesthetic complications: no   No notable events documented.   Last Vitals:  Vitals:   07/10/24 1229 07/10/24 1245  BP: 102/76 111/67  Pulse: 94 90  Resp: 19 14  Temp: 36.6 C 36.6 C  SpO2: 99% 98%    Last Pain:  Vitals:   07/10/24 1245  TempSrc:   PainSc: 0-No pain                 Fairy A Kynedi Profitt      "

## 2024-07-10 NOTE — Transfer of Care (Signed)
 Immediate Anesthesia Transfer of Care Note  Patient: Ebony Snow  Procedure(s) Performed: COLONOSCOPY WITH BIOPSY (Rectum) EGD (ESOPHAGOGASTRODUODENOSCOPY) WITH BIOPSY (Mouth)  Patient Location: PACU  Anesthesia Type: General  Level of Consciousness: awake, alert  and patient cooperative  Airway and Oxygen Therapy: Patient Spontanous Breathing and Patient connected to supplemental oxygen  Post-op Assessment: Post-op Vital signs reviewed, Patient's Cardiovascular Status Stable, Respiratory Function Stable, Patent Airway and No signs of Nausea or vomiting  Post-op Vital Signs: Reviewed and stable  Complications: No notable events documented.

## 2024-07-10 NOTE — Op Note (Signed)
 [facility] Gastroenterology Patient Name: Ebony Snow Procedure Date: 07/10/2024 11:29 AM MRN: [id number] Account #: [id number] Date of Birth: 11-Jun-2006 Admit Type: Outpatient Age: 19 Room: [facility] OR ROOM 01 Gender: Female Note Status: Finalized Instrument Name: Peds Colonoscope [id number] Procedure:             Colonoscopy Indications:           Diarrhea, Weight loss Providers:             [name], MD Referring MD:          Dorette FALCON. Sowles, MD (Referring MD) Medicines:             Propofol  per Anesthesia Complications:         No immediate complications. Procedure:             Pre-Anesthesia Assessment:                        - Prior to the procedure, a History and Physical was                         performed, and patient medications and allergies were                         reviewed. The patient's tolerance of previous                         anesthesia was also reviewed. The risks and benefits                         of the procedure and the sedation options and risks                         were discussed with the patient. All questions were                         answered, and informed consent was obtained. Prior                         Anticoagulants: The patient has taken no anticoagulant                         or antiplatelet agents. ASA Grade Assessment: I - A                         normal, healthy patient. After reviewing the risks and                         benefits, the patient was deemed in satisfactory                         condition to undergo the procedure.                        After obtaining informed consent, the colonoscope was                         passed under direct vision. Throughout the procedure,  the patient's blood pressure, pulse, and oxygen                         saturations were monitored continuously. The                         Colonoscope was introduced through the anus and                          advanced to the 10 cm into the ileum. The colonoscopy                         was unusually difficult due to restricted mobility of                         the colon. Successful completion of the procedure was                         aided by withdrawing and reinserting the scope,                         straightening and shortening the scope to obtain bowel                         loop reduction, using scope torsion and applying                         abdominal pressure. The patient tolerated the                         procedure well. The quality of the bowel preparation                         was good. The terminal ileum, ileocecal valve,                         appendiceal orifice, and rectum were photographed. Findings:      The entire examined colon appeared normal on direct and retroflexion       views.      The terminal ileum appeared normal.      Biopsies for histology were taken with a cold forceps from the entire       colon for evaluation of microscopic colitis. Impression:            - The entire examined colon is normal on direct and                         retroflexion views.                        - The examined portion of the ileum was normal.                        - Biopsies were taken with a cold forceps from the                         entire colon for evaluation of microscopic colitis. Recommendation:        - Patient has  a contact number available for                         emergencies. The signs and symptoms of potential                         delayed complications were discussed with the patient.                         Return to normal activities tomorrow. Written                         discharge instructions were provided to the patient.                        - High fiber diet and even consider a daily fiber                         supplement such as metamucil.                        - Continue present medications.                        -  Await pathology results.                        - The sigmoid colon was unusually difficult to                         negotiate due to fixed anatomy. Would consider                         endometriosis as a cause of abdominal pain.                        - The findings and recommendations were discussed with                         the designated responsible adult. Procedure Code(s):     --- Professional ---                        [phone], Colonoscopy, flexible; with biopsy, single or                         multiple Diagnosis Code(s):     --- Professional ---                        R19.7, Diarrhea, unspecified                        R63.4, Abnormal weight loss CPT copyright 2022 American Medical Association. All rights reserved. The codes documented in this report are preliminary and upon coder review may  be revised to meet current compliance requirements. [name], MD 07/10/2024 12:20:51 PM Number of Addenda: 0 Note Initiated On: 07/10/2024 11:29 AM Scope Withdrawal Time: 0 hours 4 minutes 23 seconds  Total Procedure Duration: 0 hours 15 minutes 49 seconds  Estimated Blood Loss:  Estimated blood loss: none.  [facility]

## 2024-07-10 NOTE — Op Note (Signed)
 [facility] Gastroenterology Patient Name: Ebony Snow Procedure Date: 07/10/2024 11:30 AM MRN: [id number] Account #: [id number] Date of Birth: 2005-09-17 Admit Type: Outpatient Age: 19 Room: [facility] OR ROOM 01 Gender: Female Note Status: Finalized Instrument Name: Endoscope [id number] Procedure:             Upper GI endoscopy Indications:           Persistent vomiting of unknown cause, Diarrhea, Weight                         loss Providers:             [name], MD Referring MD:          Dorette Loron, MD (Referring MD) Medicines:             Propofol  per Anesthesia Complications:         No immediate complications. Procedure:             Pre-Anesthesia Assessment:                        - Prior to the procedure, a History and Physical was                         performed, and patient medications and allergies were                         reviewed. The patient's tolerance of previous                         anesthesia was also reviewed. The risks and benefits                         of the procedure and the sedation options and risks                         were discussed with the patient. All questions were                         answered, and informed consent was obtained. Prior                         Anticoagulants: The patient has taken no anticoagulant                         or antiplatelet agents. ASA Grade Assessment: I - A                         normal, healthy patient. After reviewing the risks and                         benefits, the patient was deemed in satisfactory                         condition to undergo the procedure.                        After obtaining informed consent, the endoscope was  passed under direct vision. Throughout the procedure,                         the patient's blood pressure, pulse, and oxygen                         saturations were monitored continuously. The Endoscope                          was introduced through the mouth, and advanced to the                         third part of duodenum. The upper GI endoscopy was                         accomplished without difficulty. The patient tolerated                         the procedure well. Findings:      The examined esophagus was normal. Biopsies were taken with a cold       forceps for histology.      The entire examined stomach was normal. Biopsies were taken with a cold       forceps for histology.      The examined duodenum was normal. Biopsies for histology were taken with       a cold forceps for evaluation of celiac disease. Impression:            - Normal esophagus. Biopsied.                        - Normal stomach. Biopsied.                        - Normal examined duodenum. Biopsied. Recommendation:        - Patient has a contact number available for                         emergencies. The signs and symptoms of potential                         delayed complications were discussed with the patient.                         Return to normal activities tomorrow. Written                         discharge instructions were provided to the patient.                        - High fiber diet.                        - Continue present medications.                        - Await pathology results.                        - The findings and recommendations were discussed with  the designated responsible adult. Procedure Code(s):     --- Professional ---                        [phone], Esophagogastroduodenoscopy, flexible,                         transoral; with biopsy, single or multiple Diagnosis Code(s):     --- Professional ---                        R11.15, Cyclical vomiting syndrome unrelated to                         migraine                        R19.7, Diarrhea, unspecified                        R63.4, Abnormal weight loss CPT copyright 2022 American Medical Association. All rights  reserved. The codes documented in this report are preliminary and upon coder review may  be revised to meet current compliance requirements. [name], MD 07/10/2024 11:55:31 AM Number of Addenda: 0 Note Initiated On: 07/10/2024 11:30 AM Total Procedure Duration: 0 hours 4 minutes 42 seconds  Estimated Blood Loss:  Estimated blood loss: none.      [facility] [place]

## 2024-07-11 ENCOUNTER — Ambulatory Visit: Payer: Self-pay

## 2024-07-11 NOTE — Telephone Encounter (Signed)
 FYI Only or Action Required?: FYI only for provider: ED advised.  Patient was last seen in primary care on 07/04/2024 by Glenard Mire, MD.  Called Nurse Triage reporting Stool Color Change.  Symptoms began yesterday.  Interventions attempted: Nothing.  Symptoms are: unchanged.  Triage Disposition: Go to ED Now (Notify PCP)  Patient/caregiver understands and will follow disposition?: Unsure  Reason for Triage: Pt states had colonoscopy yesterday and now stomach is hurting and stool coming out black.    Reason for Disposition  Black or tarry bowel movements  (Exception: Chronic-unchanged black-grey BMs AND is taking iron pills or Pepto-Bismol.)  Answer Assessment - Initial Assessment Questions 1. APPEARANCE of BLOOD: What color is it? Is it passed separately, on the surface of the stool, or mixed in with the stool?      Black stool 3. FREQUENCY: How many times has blood been passed with the stools?      1 4. ONSET: When was the blood first seen in the stools? (Days or weeks)      Yesterday, since colonoscopy 5. DIARRHEA: Is there also some diarrhea? If Yes, ask: How many diarrhea stools in the past 24 hours?      denies 6. CONSTIPATION: Do you have constipation? If Yes, ask: How bad is it?     denies 7. RECURRENT SYMPTOMS: Have you had blood in your stools before? If Yes, ask: When was the last time? and What happened that time?      denies 9. OTHER SYMPTOMS: Do you have any other symptoms?  (e.g., abdomen pain, vomiting, dizziness, fever)     Abd pain-8,  10. PREGNANCY: Is there any chance you are pregnant? When was your last menstrual period?       Denies  Pt states that her BM is not this color normally, pt denies medications/diet that could cause black/discolored stool. Pt states she has informed GI this morning when they called, GI advised  pt to call PCP.  Answer Assessment - Initial Assessment Questions 1. COLOR: What color is your  stool? Is that color in part or all of the stool? (e.g., black, clay-colored, green, red)      black 2. ONSET: When did you first notice this color change?     today 3. CAUSE: Have you eaten any food or taken any medicine of this color? Note: See listing in Background Information section.     denies 4. OTHER SYMPTOMS: Do you have any other symptoms? (e.g., abdomen pain, diarrhea, fever, yellow eyes or skin).     Abd pain-8, started after the colonoscopy, denies fever.   Pt states I had surgery yesterday, and now I have black stool. Pt had a colonoscopy yesterday, denies biopsy.  Protocols used: Stools - Unusual Color-A-AH, Rectal Bleeding-A-AH

## 2024-07-16 ENCOUNTER — Ambulatory Visit: Payer: Self-pay | Admitting: Gastroenterology

## 2024-07-16 ENCOUNTER — Ambulatory Visit: Admission: EM | Admit: 2024-07-16 | Discharge: 2024-07-16 | Disposition: A | Discharge status: Home / Self Care

## 2024-07-16 DIAGNOSIS — R1084 Generalized abdominal pain: Secondary | ICD-10-CM

## 2024-07-16 DIAGNOSIS — K589 Irritable bowel syndrome without diarrhea: Secondary | ICD-10-CM

## 2024-07-16 LAB — SURGICAL PATHOLOGY

## 2024-07-16 NOTE — Discharge Instructions (Addendum)
 Please go to the emergency room for severe abdominal pain, bloody stool, black tarry stools, or worsening symptoms Please follow up with your surgeon and your PCP-call for appt Avoid spicy,greasy fried foods, avoid caffeine, avoid smoking,avoidalcohol as they will make your symptoms worse.

## 2024-07-16 NOTE — ED Provider Notes (Signed)
 " [facility]    CSN: [id number] Arrival date & time: 07/16/24  1223      History   Chief Complaint Chief Complaint  Patient presents with   Abdominal Pain    HPI Ebony Snow is a 19 y.o. female.   19 year old female pt, Ebony Snow, presents to urgent care for evaluation of generalized abdominal pain that is worse after eating everything runs through me.  Patient reports she had a colonoscopy and endoscopy 6 days prior and she had 1 black tarry stool when she got home.  Patient reports her stools are orange and loose now.  Patient denies any fever, nausea, or vomiting.  Patient endorses smoking marijuana  The history is provided by the patient. No language interpreter was used.  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/2021   MDD (major depressive disorder), recurrent episode, 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      Home Medications    Prior to Admission medications  Medication Sig Start Date End Date Taking? Authorizing Provider  ipratropium (ATROVENT ) 0.06 % nasal spray Place 2 sprays into both nostrils 4 (four) times daily. 06/06/24   Bernardino Ditch, NP  medroxyPROGESTERone  (DEPO-PROVERA ) 150 MG/ML injection Inject 1 mL (150 mg total) into the muscle every 3 (three) months. 07/04/24   Sowles, Krichna, MD    Family History Family History  Problem Relation Age of Onset   Food Allergy Brother    Alcohol abuse Maternal Uncle    Clotting disorder Cousin     Social History Social History[1]   Allergies   Patient has no known allergies.   Review of Systems Review of Systems  Constitutional:  Negative for fever.  Gastrointestinal:  Positive for abdominal pain and blood in stool. Negative for diarrhea, nausea and vomiting.  All other systems reviewed and are negative.    Physical Exam Triage Vital Signs ED Triage Vitals  Encounter Vitals Group     BP 07/16/24 1305 116/71     Girls Systolic BP Percentile --      Girls Diastolic BP Percentile --      Boys Systolic BP Percentile --      Boys Diastolic BP Percentile --      Pulse Rate 07/16/24 1305 72     Resp 07/16/24 1305 17  Temp 07/16/24 1305 97.9 F (36.6 C)     Temp src --      SpO2 07/16/24 1305 98 %     Weight 07/16/24 1304 110 lb 9.6 oz (50.2 kg)     Height --      Head Circumference --      Peak Flow --      Pain Score 07/16/24 1304 8     Pain Loc --      Pain Education --      Exclude from Growth Chart --    No data found.  Updated Vital Signs BP 116/71   Pulse 72   Temp 97.9 F (36.6 C)   Resp 17   Wt 110 lb 9.6 oz (50.2 kg)   LMP  (LMP Unknown)   SpO2 98%   BMI 19.59 kg/m   Visual Acuity Right Eye Distance:   Left Eye Distance:   Bilateral Distance:    Right Eye Near:   Left Eye Near:    Bilateral Near:     Physical  Exam Vitals and nursing note reviewed.  Constitutional:      General: She is not in acute distress.    Appearance: She is well-developed.  HENT:     Head: Normocephalic and atraumatic.  Eyes:     Conjunctiva/sclera: Conjunctivae normal.  Cardiovascular:     Rate and Rhythm: Normal rate and regular rhythm.     Heart sounds: No murmur heard. Pulmonary:     Effort: Pulmonary effort is normal. No respiratory distress.     Breath sounds: Normal breath sounds.  Abdominal:     General: Bowel sounds are increased.     Palpations: Abdomen is soft.     Tenderness: There is no abdominal tenderness. There is no guarding or rebound.  Musculoskeletal:        General: No swelling.     Cervical back: Neck supple.  Skin:    General: Skin is warm and dry.     Capillary Refill: Capillary refill takes less than 2 seconds.  Neurological:     General: No focal deficit present.     Mental Status: She is alert and oriented to person, place, and time.     GCS: GCS eye subscore is 4. GCS verbal subscore is 5. GCS motor subscore is 6.  Psychiatric:        Mood and Affect: Mood normal.      UC Treatments / Results  Labs (all labs ordered are listed, but only abnormal results are displayed) Labs Reviewed - No data to display  EKG   Radiology No results found.  Procedures Procedures (including critical care time)  Medications Ordered in UC Medications - No data to display  Initial Impression / Assessment and Plan / UC Course  I have reviewed the triage vital signs and the nursing notes.  Pertinent labs & imaging results that were available during my care of the patient were reviewed by me and considered in my medical decision making (see chart for details).     Discussed exam findings and plan of care with patient :please go to the emergency room for severe abdominal pain, bloody stool, black tarry stools, or worsening symptoms Please follow up with your surgeon and your PCP-call for  appt Avoid spicy,greasy fried foods, avoid caffeine, avoid smoking,avoidalcohol as they will make your symptoms worse.  Patient verbalized understanding to this provider, requesting work note, given  Ddx: Severe abdominal pain, generalized, irritable bowel syndrome, marijuana  use Final Clinical Impressions(s) / UC Diagnoses   Final diagnoses:  Irritable bowel syndrome, unspecified type  Generalized abdominal pain     Discharge Instructions      Please go to the emergency room for severe abdominal pain, bloody stool, black tarry stools, or worsening symptoms Please follow up with your surgeon and your PCP-call for appt Avoid spicy,greasy fried foods, avoid caffeine, avoid smoking,avoidalcohol as they will make your symptoms worse.       ED Prescriptions   None    PDMP not reviewed this encounter.     [1]  Social History Tobacco Use   Smoking status: Never   Smokeless tobacco: Never  Vaping Use   Vaping status: Every Day   Substances: Nicotine, Flavoring  Substance Use Topics   Alcohol use: Yes    Alcohol/week: 1.0 standard drink of alcohol    Types: 1 Shots of liquor per week    Comment: I dont drink often   Drug use: Yes    Frequency: 1.0 times per week    Types: Marijuana    Comment: one month ago     Aminta Loose, NP 07/16/24 1850  "

## 2024-07-16 NOTE — ED Triage Notes (Signed)
 Patient to Urgent Care with complaints of lower abdominal pain. Pains is worse when eating and using the bathroom.   Reports colonoscopy and endoscopy on Wednesday. States she had one black and tarry stool when she got home. Now stools are orange and loose.

## 2024-07-25 ENCOUNTER — Other Ambulatory Visit: Payer: Self-pay

## 2024-07-29 ENCOUNTER — Ambulatory Visit: Admitting: Family Medicine

## 2024-09-24 ENCOUNTER — Ambulatory Visit

## 2024-10-02 ENCOUNTER — Ambulatory Visit: Admitting: Family Medicine
